# Patient Record
Sex: Female | Born: 1980 | Race: White | Hispanic: No | Marital: Single | Smoking: Former smoker
Health system: Southern US, Community
[De-identification: ages and names within clinical notes are randomized; demographics above are authoritative.]

## PROBLEM LIST (undated history)

## (undated) HISTORY — PX: TONSILLECTOMY: SUR1361

## (undated) HISTORY — PX: WISDOM TOOTH EXTRACTION: SHX21

---

## 2005-09-15 ENCOUNTER — Emergency Department (HOSPITAL_COMMUNITY): Admission: EM | Admit: 2005-09-15 | Discharge: 2005-09-15 | Payer: Self-pay | Admitting: Emergency Medicine

## 2007-03-31 ENCOUNTER — Ambulatory Visit: Payer: Self-pay | Admitting: Family Medicine

## 2007-03-31 DIAGNOSIS — S8410XA Injury of peroneal nerve at lower leg level, unspecified leg, initial encounter: Secondary | ICD-10-CM | POA: Insufficient documentation

## 2007-03-31 DIAGNOSIS — F17200 Nicotine dependence, unspecified, uncomplicated: Secondary | ICD-10-CM

## 2007-11-16 ENCOUNTER — Encounter: Payer: Self-pay | Admitting: Family Medicine

## 2007-11-16 ENCOUNTER — Ambulatory Visit: Payer: Self-pay | Admitting: Family Medicine

## 2007-11-17 ENCOUNTER — Encounter: Payer: Self-pay | Admitting: Family Medicine

## 2007-11-18 ENCOUNTER — Encounter: Payer: Self-pay | Admitting: Family Medicine

## 2009-09-05 ENCOUNTER — Ambulatory Visit: Payer: Self-pay | Admitting: Family Medicine

## 2009-09-05 DIAGNOSIS — R002 Palpitations: Secondary | ICD-10-CM | POA: Insufficient documentation

## 2009-09-05 LAB — CONVERTED CEMR LAB
Alkaline Phosphatase: 45 units/L (ref 39–117)
BUN: 9 mg/dL (ref 6–23)
CO2: 23 meq/L (ref 19–32)
Cholesterol: 211 mg/dL — ABNORMAL HIGH (ref 0–200)
Creatinine, Ser: 0.87 mg/dL (ref 0.40–1.20)
Glucose, Bld: 92 mg/dL (ref 70–99)
HCT: 40.3 % (ref 36.0–46.0)
HDL: 53 mg/dL (ref >39)
MCHC: 33 g/dL (ref 30.0–36.0)
MCV: 96 fL (ref 78.0–100.0)
RBC: 4.2 M/uL (ref 3.87–5.11)
Total Bilirubin: 0.5 mg/dL (ref 0.3–1.2)
Total CHOL/HDL Ratio: 4
Triglycerides: 91 mg/dL (ref <150)
VLDL: 18 mg/dL (ref 0–40)
WBC: 8.7 10*3/uL (ref 4.0–10.5)

## 2009-09-07 ENCOUNTER — Encounter: Payer: Self-pay | Admitting: Family Medicine

## 2010-01-16 ENCOUNTER — Ambulatory Visit: Payer: Self-pay | Admitting: Family Medicine

## 2010-03-05 NOTE — Assessment & Plan Note (Signed)
Summary: cpe/pap,tcb  [facility] [address]  Vital Signs:  Patient profile:   30 year old female Height:      65.5 inches Weight:      175.4 pounds BMI:     28.85 Temp:     98.6 degrees F Pulse rate:   76 / minute BP sitting:   148 / 84  Vitals Entered By: Golden Circle RN (September 05, 2009 8:26 AM)  History of Present Illness: here for check up had pap 2 y ago--prefers to do q 3 y no problems--using niva ring. Menses regular. Unhappy witgh her weight--says she is working out 3x per week but is not as strict about eating as she once was wants to stop smoking--has questions about chantix etc  Habits & Providers  Alcohol-Tobacco-Diet     Tobacco Status: current     Tobacco Counseling: to quit use of tobacco products     Cigarette Packs/Day: 0.5  Exercise-Depression-Behavior     Does Patient Exercise: yes     Exercise (avg: min/session): 30-60     Times/week: 3     Seat Belt Use: always     Sun Exposure: infrequent     Sun Exposure Counseling: wears sunscreen  Current Medications (verified): 1)  Nuvaring 0.12-0.015 Mg/24hr Ring (Etonogestrel-Ethinyl Estradiol) .... Per Vagina As Directed Disp 3 Months 2)  Wellbutrin Sr 150 Mg Xr12h-Tab (Bupropion Hcl) .... Generic Ok Sig 1 By Mouth Once Daily As Directed  Allergies (verified): No Known Drug Allergies  Past History:  Past Medical History: Last updated: 03/31/2007 left peroneal nerve neuropathy--recurrent--EMGs by neurology reportedly normal-- ([place] [facility]) smoker  Past Surgical History: Last updated: 03/31/2007 none  Family History: Last updated: 03/31/2007 MGF ASCVD early  Social History: Last updated: 03/31/2007 works as Engineer, civil (consulting) L and D smokes 1/2 ppd--quit successully once for  months using patch--restarted for stress reduction  Risk Factors: Exercise: yes (09/05/2009)  Risk Factors: Smoking Status: current (09/05/2009) Packs/Day: 0.5 (09/05/2009)  Social History: Packs/Day:  0.5 Seat  Belt Use:  always Does Patient Exercise:  yes Sun Exposure-Excessive:  infrequent  Review of Systems       Complete 14 point review of systems is otherwise normal   Physical Exam  General:  alert, well-developed, well-nourished, and well-hydrated.   Eyes:  pupils equal, pupils round, and pupils reactive to light.   Ears:  R ear normal and L ear normal.   Neck:  supple, full ROM, no masses, no thyromegaly, and no carotid bruits.   Breasts:  skin/areolae normal, no masses, no abnormal thickening, no nipple discharge, no tenderness, and no adenopathy.   Lungs:  normal breath sounds and no wheezes.   Heart:  normal rate, regular rhythm, and no murmur.   Abdomen:  soft, non-tender, and normal bowel sounds.   Genitalia:  normal introitus, no external lesions, no vaginal discharge, normal uterus size and position, and no adnexal masses or tenderness.   Msk:  normal ROM, no joint tenderness, no joint swelling, and no joint warmth.   Pulses:  2+ B =DP Extremities:  No clubbing, cyanosis, edema, or deformity noted with normal full range of motion of all joints.   Neurologic:  alert & oriented X3, strength normal in all extremities, gait normal, and DTRs symmetrical and normal.   Skin:  turgor normal, color normal, no rashes, and no suspicious lesions.   Psych:  Oriented X3, memory intact for recent and remote, normally interactive, good eye contact, not anxious appearing, and not  depressed appearing.     Impression & Recommendations:  Problem # 1:  EXAMINATION, ROUTINE MEDICAL (ICD-V70.0)  Orders: CBC-[facility] ([address]) Comp Met-[facility] [phone]) Lipid-[facility] ([id number]) TSH-[facility] [phone]) [facility] - Est  18-39 yrs ([address])  Problem # 2:  PALPITATIONS (ICD-785.1)  Orders: TSH-[facility] (28413-24401)  Complete Medication List: 1)  Nuvaring 0.12-0.015 Mg/24hr Ring (Etonogestrel-ethinyl estradiol) .... Per vagina as directed disp 3 months 2)  Wellbutrin Sr 150 Mg Xr12h-tab (Bupropion hcl) ....  Generic ok sig 1 by mouth once daily as directed  Patient Instructions: 1)  Your next pap smear will be in one year.  2)  I would like to see you to follow up our discussion and the new medicine in about a month. 3)  I will send you a note about your labs. 4)  Great to see you! Prescriptions: WELLBUTRIN SR 150 MG XR12H-TAB (BUPROPION HCL) generic ok sig 1 by mouth once daily as directed  #30 x 2   Entered and Authorized by:   [name] MD   Signed by:   [name] MD on 09/05/2009   Method used:   [facility]ically [id number]       [address]       [place], [place]  [address]       Ph: [phone] or [phone]       Fax: [phone]   RxID:   [id number]   Appended Document: cpe/pap,tcb starting well butrin for smoking cessation

## 2010-03-05 NOTE — Letter (Signed)
Summary: LAB Letter  [facility]  [address]   [place], [place] [address]   Phone: [phone]  Fax: [phone]    09/07/2009  Michaela Lee [address] [place], [place]  [address]  Dear Ms. Oshiro,  Total cholesterol is 211 and that is OK. Your LDL (bad cholesterol is 140 and your goal is less than 160 so you are Ok there too. Your HDL (good cholesterol) is great at 53. All in all your lipid panel is great! All of the other labs were normal as well.         Triglyceride              91 mg/dL                    <324   HDL Cholesterol           53 mg/dL                    >40   Total Chol/HDL Ratio      4.0 Ratio    LDL Cholesterol (Calc)                             140 mg/dL                          Sincerely,   [name] MD   Appended Document: LAB Letter mailed

## 2010-03-07 NOTE — Assessment & Plan Note (Signed)
Summary: discuss meds/tlb   Vital Signs:  Patient profile:   30 year old female Weight:      180 pounds Temp:     98.1 degrees F oral Pulse rate:   70 / minute Pulse rhythm:   regular BP sitting:   126 / 84  (left arm) Cuff size:   regular  Vitals Entered By: Loralee Pacas CMA (January 16, 2010 10:00 AM) CC: meds   CC:  meds.  History of Present Illness: f/u starting welbutrin for smoking cessation--first two weeks she took it she felt a little "weepy", but then in thrird week she lost all desire for a cigarette. Did well until her Mom had a stroke-2 weeks ago.  She had no more refills andshe is back to smoking. Wants to restart welbutrin. Otherwise is feeling well. Did not have any problems with eth welbutrin.  Allergies: No Known Drug Allergies  Review of Systems       The patient complains of weight gain.  The patient denies anorexia, fever, weight loss, and dyspnea on exertion.    Physical Exam  General:  alert, well-developed, well-nourished, and well-hydrated.   Lungs:  normal breath sounds.     Impression & Recommendations:  Problem # 1:  SMOKER (ICD-305.1)  will restar welbutrin--we spent > 50% 40 minute ov discussing tobacco addiction and treatment options. rtc 2-3 months.I think at this time we willplan to do a full 4-6 m of welbutrin and she is in agreement.  Orders: [facility]- Est  Level 4 ([address])  Complete Medication List: 1)  Nuvaring 0.12-0.015 Mg/24hr Ring (Etonogestrel-ethinyl estradiol) .... Per vagina as directed disp 3 months 2)  Wellbutrin Sr 150 Mg Xr12h-tab (Bupropion hcl) .... Generic ok sig 1 by mouth once daily as directed Prescriptions: WELLBUTRIN SR 150 MG XR12H-TAB (BUPROPION HCL) generic ok sig 1 by mouth once daily as directed  #30 x 5   Entered and Authorized by:   [name] MD   Signed by:   [name] MD on 01/16/2010   Method used:   [facility] to        [facility] [address] [id number]       [address]       [place], [place]   [address]       Ph: [phone] or [phone]       Fax: [phone]   RxID:   [id number]    Orders Added: 1)  [facility]- Est  Level 4 [[address]]

## 2010-03-14 ENCOUNTER — Encounter: Payer: Self-pay | Admitting: *Deleted

## 2010-12-12 ENCOUNTER — Other Ambulatory Visit: Payer: Self-pay | Admitting: Family Medicine

## 2011-07-14 ENCOUNTER — Emergency Department (HOSPITAL_BASED_OUTPATIENT_CLINIC_OR_DEPARTMENT_OTHER): Payer: Managed Care, Other (non HMO)

## 2011-07-14 ENCOUNTER — Encounter (HOSPITAL_BASED_OUTPATIENT_CLINIC_OR_DEPARTMENT_OTHER): Payer: Self-pay | Admitting: *Deleted

## 2011-07-14 ENCOUNTER — Emergency Department (HOSPITAL_BASED_OUTPATIENT_CLINIC_OR_DEPARTMENT_OTHER)
Admission: EM | Admit: 2011-07-14 | Discharge: 2011-07-14 | Disposition: A | Discharge status: Home / Self Care | Payer: Managed Care, Other (non HMO) | Attending: Emergency Medicine | Admitting: Emergency Medicine

## 2011-07-14 DIAGNOSIS — F17200 Nicotine dependence, unspecified, uncomplicated: Secondary | ICD-10-CM | POA: Insufficient documentation

## 2011-07-14 DIAGNOSIS — X58XXXA Exposure to other specified factors, initial encounter: Secondary | ICD-10-CM | POA: Insufficient documentation

## 2011-07-14 DIAGNOSIS — S93602A Unspecified sprain of left foot, initial encounter: Secondary | ICD-10-CM

## 2011-07-14 DIAGNOSIS — S8990XA Unspecified injury of unspecified lower leg, initial encounter: Secondary | ICD-10-CM | POA: Insufficient documentation

## 2011-07-14 DIAGNOSIS — Y998 Other external cause status: Secondary | ICD-10-CM | POA: Insufficient documentation

## 2011-07-14 DIAGNOSIS — Y9389 Activity, other specified: Secondary | ICD-10-CM | POA: Insufficient documentation

## 2011-07-14 NOTE — ED Provider Notes (Signed)
History     CSN: [id number]  Arrival date & time 07/14/11  5621   First MD Initiated Contact with Patient 07/14/11 0357      Chief Complaint  Patient presents with  . Foot Injury    (Consider location/radiation/quality/duration/timing/severity/associated sxs/prior treatment) HPI This-year-old white female who was at work yesterday evening. 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 She developed a sudden onset of a sharp pain on the medial aspect of her left foot. This pain radiated up her left lower leg. She states the pain is like that of shin splints although it is not originating in her shin. She's had some improvement in the pain after elevating it, taking ibuprofen and applying ice. The pain is not exacerbated by palpation that is present when she ambulates. The pain is moderate to severe at its worst, minimal at rest. Ambulatory pain is relieved by walking on the lateral aspect of her foot.  History reviewed. No pertinent past medical history.  Past Surgical History  Procedure Date  . Tonsillectomy     History reviewed. No pertinent family history.  History  Substance Use Topics  . Smoking status: Current Everyday Smoker  . Smokeless tobacco: Not on file  . Alcohol Use: No    OB History    Grav Para Term Preterm Abortions TAB SAB Ect Mult Living                  Review of Systems  All other systems reviewed and are negative.    Allergies  Review of patient's allergies indicates no known allergies.  Home Medications   Current Outpatient Rx  Name Route Sig Dispense Refill  . BUPROPION HCL ER (SR) 150 MG PO TB12  Generic ok. SIG 1 by mouth once daily as directed     . ETONOGESTREL-ETHINYL ESTRADIOL 0.12-0.015 MG/24HR VA RING  Per vagina as directed. Disp 3 months       BP 128/92  Pulse 77  Temp(Src) 98.2 F (36.8 C) (Oral)  Resp 16  SpO2 100%  LMP 07/09/2011  Physical Exam General: Well-developed, well-nourished  female in no acute distress; appearance consistent with age of record HENT: normocephalic, atraumatic Eyes: Normal appearance Neck: supple Heart: regular rate and rhythm Lungs: Normal respiratory effort and excursion Abdomen: soft; nondistended Extremities: No deformity; full range of motion; pulses normal; no swelling, ecchymosis, tenderness or instability of left foot but pain elicited on weight-bearing Neurologic: Awake, alert and oriented; motor function intact in all extremities and symmetric; no facial droop Skin: Warm and dry Psychiatric: Normal mood and affect    ED Course  Procedures (including critical care time)     MDM   Nursing notes and vitals signs, including pulse oximetry, reviewed.  Summary of this visit's results, reviewed by myself:   Imaging Studies: Dg Foot Complete Left  07/14/2011  *RADIOLOGY REPORT*  Clinical Data: Medial foot pain  LEFT FOOT - COMPLETE 3+ VIEW  Comparison: None.  Findings: No acute fracture or dislocation.  No aggressive osseous lesions.  Intact Lisfranc joint.  Posterior calcaneal enthesopathic change.  IMPRESSION: No acute osseous abnormality identified. If clinical concern for a fracture persists, recommend a repeat radiograph in 5-10 days to evaluate for interval change or callus formation.  Original Report Authenticated By: Waneta Martins, M.D.            Hanley Seamen, MD 07/14/11 [phone]

## 2011-07-14 NOTE — ED Notes (Signed)
Pt states that around 2300 she was playing with a child and jumping pt jumped onto the floor and began having severe sudden onset of sharp shooting pain on top of left foot.

## 2011-07-14 NOTE — Discharge Instructions (Signed)
Foot Sprain  The muscles and cord like structures which attach muscle to bone (tendons) that surround the feet are made up of units. A foot sprain can occur at the weakest spot in any of these units. This condition is most often caused by injury to or overuse of the foot, as from playing contact sports, or aggravating a previous injury, or from poor conditioning, or obesity.  SYMPTOMS  · Pain with movement of the foot.  · Tenderness and swelling at the injury site.  · Loss of strength is present in moderate or severe sprains.  THE THREE GRADES OR SEVERITY OF FOOT SPRAIN ARE:  · Mild (Grade I): Slightly pulled muscle without tearing of muscle or tendon fibers or loss of strength.  · Moderate (Grade II): Tearing of fibers in a muscle, tendon, or at the attachment to bone, with small decrease in strength.  · Severe (Grade III): Rupture of the muscle-tendon-bone attachment, with separation of fibers. Severe sprain requires surgical repair. Often repeating (chronic) sprains are caused by overuse. Sudden (acute) sprains are caused by direct injury or over-use.  DIAGNOSIS   Diagnosis of this condition is usually by your own observation. If problems continue, a caregiver may be required for further evaluation and treatment. X-rays may be required to make sure there are not breaks in the bones (fractures) present. Continued problems may require physical therapy for treatment.  PREVENTION  · Use strength and conditioning exercises appropriate for your sport.  · Warm up properly prior to working out.  · Use athletic shoes that are made for the sport you are participating in.  · Allow adequate time for healing. Early return to activities makes repeat injury more likely, and can lead to an unstable arthritic foot that can result in prolonged disability. Mild sprains generally heal in 3 to 10 days, with moderate and severe sprains taking 2 to 10 weeks. Your caregiver can help you determine the proper time required for  healing.  HOME CARE INSTRUCTIONS   · Apply ice to the injury for 15 to 20 minutes, 3 to 4 times per day. Put the ice in a plastic bag and place a towel between the bag of ice and your skin.  · An elastic wrap (like an Ace bandage) may be used to keep swelling down.  · Keep foot above the level of the heart, or at least raised on a footstool, when swelling and pain are present.  · Try to avoid use other than gentle range of motion while the foot is painful. Do not resume use until instructed by your caregiver. Then begin use gradually, not increasing use to the point of pain. If pain does develop, decrease use and continue the above measures, gradually increasing activities that do not cause discomfort, until you gradually achieve normal use.  · Use crutches if and as instructed, and for the length of time instructed.  · Keep injured foot and ankle wrapped between treatments.  · Massage foot and ankle for comfort and to keep swelling down. Massage from the toes up towards the knee.  · Only take over-the-counter or prescription medicines for pain, discomfort, or fever as directed by your caregiver.  SEEK IMMEDIATE MEDICAL CARE IF:   · Your pain and swelling increase, or pain is not controlled with medications.  · You have loss of feeling in your foot or your foot turns cold or blue.  · You develop new, unexplained symptoms, or an increase of the symptoms that brought you   to your caregiver.  MAKE SURE YOU:   · Understand these instructions.  · Will watch your condition.  · Will get help right away if you are not doing well or get worse.  Document Released: 07/12/2001 Document Revised: 01/09/2011 Document Reviewed: 09/09/2007  ExitCare® Patient Information ©2012 [employer].

## 2011-12-24 ENCOUNTER — Encounter: Payer: Self-pay | Admitting: Family Medicine

## 2011-12-24 ENCOUNTER — Other Ambulatory Visit (HOSPITAL_COMMUNITY)
Admission: RE | Admit: 2011-12-24 | Discharge: 2011-12-24 | Disposition: A | Discharge status: Home / Self Care | Payer: Managed Care, Other (non HMO) | Source: Ambulatory Visit | Attending: Family Medicine | Admitting: Family Medicine

## 2011-12-24 ENCOUNTER — Ambulatory Visit (INDEPENDENT_AMBULATORY_CARE_PROVIDER_SITE_OTHER): Payer: Managed Care, Other (non HMO) | Admitting: Family Medicine

## 2011-12-24 VITALS — BP 125/81 | HR 57 | Temp 98.0°F | Ht 65.0 in | Wt 178.0 lb

## 2011-12-24 DIAGNOSIS — Z01419 Encounter for gynecological examination (general) (routine) without abnormal findings: Secondary | ICD-10-CM | POA: Insufficient documentation

## 2011-12-24 DIAGNOSIS — M719 Bursopathy, unspecified: Secondary | ICD-10-CM

## 2011-12-24 DIAGNOSIS — M75100 Unspecified rotator cuff tear or rupture of unspecified shoulder, not specified as traumatic: Secondary | ICD-10-CM

## 2011-12-24 DIAGNOSIS — Z124 Encounter for screening for malignant neoplasm of cervix: Secondary | ICD-10-CM

## 2011-12-24 DIAGNOSIS — Z0000 Encounter for general adult medical examination without abnormal findings: Secondary | ICD-10-CM

## 2011-12-24 MED ORDER — BUPROPION HCL ER (SR) 150 MG PO TB12: ORAL_TABLET | ORAL | Status: DC

## 2011-12-25 ENCOUNTER — Encounter: Payer: Self-pay | Admitting: Family Medicine

## 2011-12-29 NOTE — Progress Notes (Signed)
  Subjective:    Patient ID: Michaela Lee, female    DOB: 1980-12-24, 31 y.o.   MRN: [id number]  HPI  welll check up 3 additiopnal issues 1) left shoulder pain--started with some new work out at gym. Pain with overhead lifting and forward reaching. No numbness. 2) stressors. Mood lability. Feels stressed all of the time. No SI/HI.   Friends tell her she needs to take a "chill pill". 3) wants to do zyban again as she has restarted smoking. It worked well last time.  Review of Systems  Constitutional: Negative for fever, activity change, appetite change, fatigue and unexpected weight change.  HENT: Negative for neck pain.   Eyes: Negative for pain and visual disturbance.  Respiratory: Negative for cough and shortness of breath.   Cardiovascular: Negative for chest pain.  Gastrointestinal: Negative for abdominal pain.  Genitourinary: Negative for dysuria, menstrual problem and pelvic pain.  Musculoskeletal: Negative for joint swelling.  Neurological: Negative for headaches.  Psychiatric/Behavioral: Negative for suicidal ideas, hallucinations, behavioral problems, sleep disturbance, self-injury and dysphoric mood. The patient is nervous/anxious.        Objective:   Physical Exam  Constitutional: She is oriented to person, place, and time. She appears well-developed and well-nourished.  HENT:  Head: Normocephalic and atraumatic.  Right Ear: External ear normal.  Left Ear: External ear normal.  Nose: Nose normal.  Mouth/Throat: Oropharynx is clear and moist.  Eyes: Conjunctivae normal and EOM are normal. Pupils are equal, round, and reactive to light.  Neck: Normal range of motion. Neck supple.  Cardiovascular: Normal rate, regular rhythm and normal heart sounds.   Pulmonary/Chest: Effort normal and breath sounds normal.  Abdominal: Soft. Bowel sounds are normal.  Genitourinary: Vagina normal and uterus normal.  Musculoskeletal:       All joints FROm except left shoulder which has  FROm but pain with supraspinatus testing.  Neurological: She is alert and oriented to person, place, and time. She has normal reflexes.  Skin: No rash noted.  Psychiatric: She has a normal mood and affect. Her behavior is normal. Judgment and thought content normal.     INJECTION: Patient was given informed consent, signed copy in the chart. Appropriate time out was taken. Area prepped and draped in usual sterile fashion. 1 cc of methylprednisolone 40 mg/ml plus  4 cc of 1% lidocaine without epinephrine was injected into the left subacromial bursa using a(n) posterior approach. The patient tolerated the procedure well. There were no complications. Post procedure instructions were given.      Assessment & Plan:  1. Well woman--pap. Discussed exercise 2. Smoking 3. Anxiety and stress--will restart wellbutrin for both #2 and 33. RTC 1 m. 3. Subacromial bursitis--CSI today.

## 2012-01-06 ENCOUNTER — Telehealth: Payer: Self-pay | Admitting: Family Medicine

## 2012-01-06 NOTE — Telephone Encounter (Signed)
Patient is still feelling the shoulder pain.  She was told when last here to let Dr. Jennette Kettle know if it didn't get any better after a week, but it has now been 2 weeks and it still isn't better.  She would like to speak to Dr. Jennette Kettle about what to do next.

## 2012-01-07 NOTE — Telephone Encounter (Signed)
Dear Michaela Lee Team Please tell her I am out sick and will be back tomorrow and I will call her then--then route msg back to me so I actually WILl call her THANKS! [name]

## 2012-01-07 NOTE — Telephone Encounter (Signed)
Spoke with patient and informed her of below, she says that 5:30pm will be the best time to call her due to her having to work

## 2012-01-09 NOTE — Telephone Encounter (Signed)
Left mssg ion VM. I would rec either [place] at [facility] or MRI.I will try to call her back Monday [name]

## 2012-01-13 NOTE — Telephone Encounter (Signed)
Left another voice mail asking her which option she wanted and asked her to call me with her decision ([facility] appt vs MRI) [name]

## 2012-01-25 ENCOUNTER — Other Ambulatory Visit: Payer: Self-pay | Admitting: Family Medicine

## 2012-02-02 ENCOUNTER — Other Ambulatory Visit: Payer: Self-pay | Admitting: Family Medicine

## 2012-02-02 MED ORDER — ETONOGESTREL-ETHINYL ESTRADIOL 0.12-0.015 MG/24HR VA RING: VAGINAL_RING | VAGINAL | Status: AC

## 2012-02-02 NOTE — Telephone Encounter (Signed)
Patient is calling because her pharmacy has not heard back on the refill on Nuvaring.

## 2012-02-16 ENCOUNTER — Telehealth: Payer: Self-pay | Admitting: Family Medicine

## 2012-02-16 DIAGNOSIS — M25512 Pain in left shoulder: Secondary | ICD-10-CM

## 2012-02-16 NOTE — Telephone Encounter (Signed)
Pt called her insurance and she can have an MRI if she goes thru [facility] imaging Would like to talk to Dr Jennette Kettle also

## 2012-02-18 NOTE — Telephone Encounter (Signed)
Amy See if you can set up MR ARTHROGRAm of left shoulder at [facility]. THANKS! [name]

## 2012-02-19 NOTE — Telephone Encounter (Signed)
Scheduled pt for appt MR Arthrogram Lt shoulder on 02/26/12 @ 915 am.  [facility]) [address] [place], [place] Fax [phone] Phone [phone]

## 2012-02-20 NOTE — Telephone Encounter (Signed)
Spoke with pt; gave her appt info

## 2012-02-20 NOTE — Telephone Encounter (Signed)
Left pt a VM to return my call  

## 2012-02-25 ENCOUNTER — Other Ambulatory Visit: Payer: Self-pay | Admitting: *Deleted

## 2012-02-25 ENCOUNTER — Telehealth: Payer: Self-pay | Admitting: Family Medicine

## 2012-02-25 DIAGNOSIS — M25511 Pain in right shoulder: Secondary | ICD-10-CM

## 2012-02-25 NOTE — Telephone Encounter (Signed)
Per Dr. Jennette Kettle- changed order to rt shoulder MRI- as pt states this is the side that hurts.

## 2012-02-25 NOTE — Telephone Encounter (Signed)
Spoke with pt- gave her results per Dr. Jennette Kettle.  She scheduled a f/u here at [facility] with Dr. Jennette Kettle.

## 2012-02-25 NOTE — Telephone Encounter (Signed)
Amy plz tell her the MRA of shoulder shows NO ROTATOR CUFF TEAR, Mild tendinopathy and some AC joint arthritis. If her painis still a problem, I would recommend she see me at [facility] for US guided [facility] joint injection. THANKS! .[name]

## 2012-03-01 ENCOUNTER — Ambulatory Visit (INDEPENDENT_AMBULATORY_CARE_PROVIDER_SITE_OTHER): Payer: Managed Care, Other (non HMO) | Admitting: Family Medicine

## 2012-03-01 ENCOUNTER — Encounter: Payer: Self-pay | Admitting: Family Medicine

## 2012-03-01 VITALS — BP 120/78 | HR 76 | Ht 65.0 in | Wt 178.0 lb

## 2012-03-01 DIAGNOSIS — M25511 Pain in right shoulder: Secondary | ICD-10-CM | POA: Insufficient documentation

## 2012-03-01 DIAGNOSIS — M25519 Pain in unspecified shoulder: Secondary | ICD-10-CM

## 2012-03-01 NOTE — Progress Notes (Signed)
  Subjective:    Patient ID: Michaela Lee, female    DOB: 11-16-1980, 32 y.o.   MRN: [id number]  HPI  Followup right shoulder pain. Continues to have pain when she uses her arm particularly and picking something up or if she has to raise her arm above shoulder level. No numbness in the hand. Had her MR arthrogram done. Has a lot of questions.  Review of Systems No numbness or weakness in the right upper extremity. No fever.    Objective:   Physical Exam Vital signs are reviewed GENERAL: Well-developed female no acute distress SHOULDER: Right. Full range of motion and strength in all planes the rotator cuff. She does have some pain with supraspinatus testing. Scapular motion bilaterally are symmetrical. ULTRASOUND: Some calcifications in the supraspinatus muscle insertion point. The subacromial bursa is not well seen. Off for the muscles of the rotator cuff exhibits normal anatomy with no tears. The a.c. Joint shows minimal to mild arthropathy with no effusion. There are a few osteophytes noted. IMAGING: Reviewed her MRI and MR arthrogram pictures with her.       Assessment & Plan:  #1. Supraspinatus tendinopathy. Long discussion explaining she does still have pain even though she has the rotator cuff tear. We will start her on rotator cuff program and I'll see her back in 4-6 weeks.

## 2012-03-03 ENCOUNTER — Ambulatory Visit: Payer: Managed Care, Other (non HMO) | Admitting: Family Medicine

## 2012-04-06 ENCOUNTER — Telehealth: Payer: Self-pay | Admitting: Family Medicine

## 2012-04-06 NOTE — Telephone Encounter (Signed)
Dr Jennette Kettle pt already  has stop taking med for 4 days now,she has an upcoming appt on the 14th of March and will like to discuss alternative medication or treatment. Proposito, [name]

## 2012-04-06 NOTE — Telephone Encounter (Signed)
Dear Cliffton Asters Team If she wants to stop it---yes she should taper it---but REALLY she should probably come see me so we can discuss. THANKS! [name]

## 2012-04-06 NOTE — Telephone Encounter (Signed)
Message copied by Nestor Ramp on Tue Apr 06, 2012  3:14 PM ------      Message from: Lizbeth Bark      Created: Tue Apr 06, 2012 11:46 AM      Regarding: med question      Contact: [phone]       Pt called states that her medication Wellbutrin is causing anxiety,depression. She wants to quit taking it, but wants to know if she needs to wean off of it. This is a pt of yours from [facility] but since they were closed she called here.  ------

## 2012-04-07 NOTE — Telephone Encounter (Signed)
THANKS! [name]  

## 2012-04-16 ENCOUNTER — Ambulatory Visit (INDEPENDENT_AMBULATORY_CARE_PROVIDER_SITE_OTHER): Payer: Managed Care, Other (non HMO) | Admitting: Family Medicine

## 2012-04-16 ENCOUNTER — Encounter: Payer: Self-pay | Admitting: Family Medicine

## 2012-04-16 VITALS — BP 124/82 | Ht 65.0 in | Wt 170.0 lb

## 2012-04-16 DIAGNOSIS — M7661 Achilles tendinitis, right leg: Secondary | ICD-10-CM

## 2012-04-16 DIAGNOSIS — M7660 Achilles tendinitis, unspecified leg: Secondary | ICD-10-CM

## 2012-04-16 DIAGNOSIS — M25519 Pain in unspecified shoulder: Secondary | ICD-10-CM

## 2012-04-16 DIAGNOSIS — M25511 Pain in right shoulder: Secondary | ICD-10-CM

## 2012-04-16 MED ORDER — NITROGLYCERIN 0.2 MG/HR TD PT24: MEDICATED_PATCH | TRANSDERMAL | Status: DC

## 2012-04-16 NOTE — Patient Instructions (Addendum)
Cut patch into one - fourth pieces. Place a one fourth piece of patch on  skin over affected area, changing to a new piece every 24 hours.  OK to do almost anything except run. See me back in 2-4 weeks  CALL [facility] with any problems. [id number]

## 2012-04-16 NOTE — Progress Notes (Signed)
  Subjective:    Patient ID: Michaela Lee, female    DOB: [birth year], 32 y.o.   MRN: [id number]  HPI  F/u right shoulder pain. 80% better. Still not able to do all activities such as lifting heavy things. No new symptoms of hand numbness. Has been doing her exercises regularly 2. Right achilles pain. Has been seen at podiatry and they did [place] as well asan injection and gace her a brace. This was 2 weeks ago. None of that seemed to help. Startd as insiduous pain---worse with use oof elliptical machine. Walking made worse. No swelling. No redness,no numbness.   Review of Systems See hpi     Objective:   Physical Exam  Vital signs reviewed. GENERAL: Well developed, well nourished, no acute distress RIGHT shoulder FROm in all planes of rortaor cuff with normal strenth. Still some pain wioth impingement testing. Achilles: no defect. Full strength dorsi andplantar flexion. Normal Thompsons. Mildly TTP at 1 inch above insertional area ACHILLES [place]: right: no defect. There is some surrounding fluid andincreased doppler activity      Assessment & Plan:  1. Shoulder pain---better. Neg MRI/arthrogram 2. 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

## 2012-05-03 ENCOUNTER — Ambulatory Visit (INDEPENDENT_AMBULATORY_CARE_PROVIDER_SITE_OTHER): Payer: Managed Care, Other (non HMO) | Admitting: Family Medicine

## 2012-05-03 ENCOUNTER — Encounter: Payer: Self-pay | Admitting: Family Medicine

## 2012-05-03 VITALS — BP 128/74 | HR 69 | Ht 65.0 in | Wt 170.0 lb

## 2012-05-03 DIAGNOSIS — M7660 Achilles tendinitis, unspecified leg: Secondary | ICD-10-CM | POA: Insufficient documentation

## 2012-05-03 DIAGNOSIS — M7661 Achilles tendinitis, right leg: Secondary | ICD-10-CM

## 2012-05-03 NOTE — Assessment & Plan Note (Signed)
Improved. She still quite concerned that she's not having any real significant pain and no weakness. I will start her on partial E. centric exercises, deep pressing only about half of the way down and with a slow plantarflexion phase. I wonder do all of this very controlled. Am also going to put three-quarter inch heel wedges that are 3/4 long in her workout shoes. She's got a lot of things going on in her life right now including her grandmothers in the hospital I think she's very worried about her Achilles tendon, very worried that she's not been able to exercise and lose the weight she's gained. See her back in 3 weeks

## 2012-05-03 NOTE — Progress Notes (Signed)
  Subjective:    Patient ID: Michaela Lee, female    DOB: Sep 28, 1980, 32 y.o.   MRN: [id number]  HPI #1. Followup right Achilles tendinitis. She has been using the nitroglycerin patch. She increased the size of the patch from 1/4-1/2 in hopes that she would have quicker improvement. She's had no side effects. She still has some twinges of pain when she does a lot of fast walking. Was on the treadmill and had the speed said at 3.2, which she says is slower than her usual speed, and did not have any sharp pain but felt some twinges. She got frustrated and stopped.   Review of Systems Denies any numbness in the foot. No redness of the Achilles tendon. No fever.    Objective:   Physical Exam Vital signs are reviewed GENERAL: Well-developed female no acute distress ACHILLES TENDON: Right. No defect. Normal plantar and dorsiflexion it is painless. No tenderness to palpation around the heel.       Assessment & Plan:

## 2012-06-14 ENCOUNTER — Ambulatory Visit (INDEPENDENT_AMBULATORY_CARE_PROVIDER_SITE_OTHER): Payer: Managed Care, Other (non HMO) | Admitting: Family Medicine

## 2012-06-14 ENCOUNTER — Encounter: Payer: Self-pay | Admitting: Family Medicine

## 2012-06-14 VITALS — BP 115/78 | HR 86 | Ht 65.0 in | Wt 170.0 lb

## 2012-06-14 DIAGNOSIS — M7661 Achilles tendinitis, right leg: Secondary | ICD-10-CM

## 2012-06-14 DIAGNOSIS — M7660 Achilles tendinitis, unspecified leg: Secondary | ICD-10-CM

## 2012-06-14 DIAGNOSIS — M25571 Pain in right ankle and joints of right foot: Secondary | ICD-10-CM

## 2012-06-14 DIAGNOSIS — M25579 Pain in unspecified ankle and joints of unspecified foot: Secondary | ICD-10-CM

## 2012-06-14 NOTE — Patient Instructions (Addendum)
[facility] Dr. Luiz Blare Monday May 19th at 930a [address] [place] [phone]

## 2012-06-14 NOTE — Progress Notes (Signed)
  Subjective:    Patient ID: Michaela Lee, female    DOB: 09-20-1980, 32 y.o.   MRN: [id number]  HPI Right ankle pain. We have been treating her for Achilles tendinitis with the nitroglycerin patch. That's a little bit better. She still wakes up at night with some occasional random tingly pains in the Achilles but overall it is better  Twice in the last few weeks she's had to miss work because of a new kind of ankle pain. Both times it occurred when she was walking very fast down the hall at work. She felt a sharp anterior ankle pain that lasts for several hours. During that time she was unable to bear weight. She's had no new ankle injury that she is aware of. She did have ankle twisting episode a few months ago. She's very frustrated that this is continuing to cause her problems.   Review of Systems No ankle redness, no ankle warmth. No fever.    Objective:   Physical Exam Vital signs are reviewed GENERAL: Well-developed female no acute distress ANKLE: Right. Anterior drawer is normal. Inversion and eversion is normal she can do several heel raises on the single stance on that foot without pain. The ankle joint itself reveals no effusion, nontender to palpation.       Assessment & Plan:  Ankle pain that does not seem to related to Achilles tendinitis. I am unclear what this could be when she's having some type of beard think she ever joint capsule. This point I think we need a second opinion about options for further workup and I'll set her up to see orthopedics

## 2013-03-09 ENCOUNTER — Telehealth: Payer: Self-pay | Admitting: Family Medicine

## 2013-03-09 NOTE — Telephone Encounter (Signed)
I already take care of three generations so I would be happy to add her in as a patient THANKS! Michaela LevySara Haywood Meinders

## 2013-03-09 NOTE — Telephone Encounter (Signed)
Pt would like to ask for special permission for Dr Jennette KettleNeal to take her mother as a new patient. Her mother is Michaela Lee  [birth year]55 Mother went to urgent care today because she was light headed. Her BP was 201/107. She has had a stroke in the past Please advise

## 2013-03-09 NOTE — Telephone Encounter (Signed)
Dr. Jennette KettleNeal not currently taking new patients.  Will route request to Dr. Jennette KettleNeal.  [name], [name], RN

## 2013-03-09 NOTE — Telephone Encounter (Signed)
Will route to Michaela ShayJackie Lee (new Pt coordinator) to set up appt with Dr. Jennette KettleNeal.  [name], [name], RN

## 2013-03-23 ENCOUNTER — Encounter (INDEPENDENT_AMBULATORY_CARE_PROVIDER_SITE_OTHER): Payer: Self-pay

## 2013-03-23 ENCOUNTER — Encounter: Payer: Self-pay | Admitting: Family Medicine

## 2013-03-23 ENCOUNTER — Ambulatory Visit (INDEPENDENT_AMBULATORY_CARE_PROVIDER_SITE_OTHER): Payer: Managed Care, Other (non HMO) | Admitting: Family Medicine

## 2013-03-23 VITALS — BP 120/80 | HR 76 | Ht 66.0 in | Wt 174.0 lb

## 2013-03-23 DIAGNOSIS — M25571 Pain in right ankle and joints of right foot: Secondary | ICD-10-CM

## 2013-03-23 DIAGNOSIS — M25579 Pain in unspecified ankle and joints of unspecified foot: Secondary | ICD-10-CM

## 2013-03-23 NOTE — Patient Instructions (Signed)
You were given a repeat cortisone injection today. Cam walker with heel lifts and crutches over next 7-10 days. Light duty as written for next 10 days. Icing 15 minutes at a time 3-4 times a day as needed. Do not do achilles exercises during this rest period. Follow up here or with Dr. Jennette KettleNeal in 7-10 days.

## 2013-03-24 ENCOUNTER — Telehealth: Payer: Self-pay | Admitting: Family Medicine

## 2013-03-25 ENCOUNTER — Encounter: Payer: Self-pay | Admitting: Family Medicine

## 2013-03-25 DIAGNOSIS — M25571 Pain in right ankle and joints of right foot: Secondary | ICD-10-CM | POA: Insufficient documentation

## 2013-03-25 NOTE — Telephone Encounter (Signed)
She will have to be out of work then until we see her back in about 10 days.  Thanks!

## 2013-03-25 NOTE — Progress Notes (Signed)
Patient ID: Michaela Lee, female   DOB: [birth year], 33 y.o.   MRN: [id number]  PCP: Denny LevySara Neal, MD  Subjective:   HPI: Patient is a 33 y.o. female here for right ankle pain.  Patient reports she has had issues with right ankle dating back to July 2013 - when dancing inverted ankle. She had been treated mainly for achilles tendinitis including exercise program, icing, OTCs as needed, nitro patches. Didn't improve and started to get pain radiating anteriorly - referred to ortho. Had an MRI that showed some low signal within tibiotalar joint possibly due to loose bodies. Was given an intraarticular injection which helped until past 2 days. Went back to running admittedly more quickly than she should have. Pain developed similarly posterior ankle radiating to front. Is 7/10 level of pain. Difficulty bearing weight. Tried doing home exercises which seemed to worsen her pain. Tried heel cups, orthotics past couple days also.  History reviewed. No pertinent past medical history.  Current Outpatient Prescriptions on File Prior to Visit  Medication Sig Dispense Refill  . etonogestrel-ethinyl estradiol (NUVARING) 0.12-0.015 MG/24HR vaginal ring Insert vaginally and leave in place for 3 consecutive weeks, then remove for 1 week.  3 each  11  . nitroGLYCERIN (NITRODUR - DOSED IN MG/24 HR) 0.2 mg/hr Cut patch into one - fourth pieces. Place a one fourth piece of patch on  skin over affected area, changing to a new piece every 24 hours.  30 patch  1   No current facility-administered medications on file prior to visit.    Past Surgical History  Procedure Laterality Date  . Tonsillectomy    . Wisdom tooth extraction      No Known Allergies  History   Social History  . Marital Status: Single    Spouse Name: N/A    Number of Children: N/A  . Years of Education: N/A   Occupational History  . Not on file.   Social History Main Topics  . Smoking status: Former Smoker    Quit date:  01/08/2012  . Smokeless tobacco: Not on file  . Alcohol Use: No  . Drug Use: No  . Sexual Activity: Not on file   Other Topics Concern  . Not on file   Social History Narrative  . No narrative on file    Family History  Problem Relation Age of Onset  . Hypertension Mother     BP 120/80  Pulse 76  Ht 5\' 6"  (1.676 m)  Wt 174 lb (78.926 kg)  BMI 28.10 kg/m2  Review of Systems: See HPI above.    Objective:  Physical Exam:  Gen: NAD  Right ankle/foot: No gross deformity, swelling, ecchymoses FROM ankle with 5/5 strength all directions. TTP within achilles and anterior ankle joint but very mild.  Pain level greater than that of exam. Negative ant drawer and talar tilt.   Negative syndesmotic compression. Thompsons test negative. NV intact distally.  MSK u/s:  No evidence of tear, neovascularity of achilles.    Assessment & Plan:  1. Right foot/ankle pain - likely related to combination of achilles tendinopathy and probable loose bodies within ankle joint.  Repeated her intraarticular cortisone injection today.  Cam walker with heel lifts, crutches.  Placed on light duty.  Icing, nsaids as needed.  F/u in 7-10 days to reassess.  After informed written consent patient was seated on exam table.  Ultrasound used to identify lateral aspect of right ankle joint, alcohol swab used for prep then right ankle  injected intraarticularly with 2:1 marcaine: depomedrol.  Patient tolerated procedure well without immediate complications.

## 2013-03-25 NOTE — Assessment & Plan Note (Signed)
likely related to combination of achilles tendinopathy and probable loose bodies within ankle joint.  Repeated her intraarticular cortisone injection today.  Cam walker with heel lifts, crutches.  Placed on light duty.  Icing, nsaids as needed.  F/u in 7-10 days to reassess.  After informed written consent patient was seated on exam table.  Ultrasound used to identify lateral aspect of right ankle joint, alcohol swab used for prep then right ankle injected intraarticularly with 2:1 marcaine: depomedrol.  Patient tolerated procedure well without immediate complications.

## 2013-03-30 ENCOUNTER — Encounter: Payer: Self-pay | Admitting: Family Medicine

## 2013-03-30 ENCOUNTER — Ambulatory Visit (INDEPENDENT_AMBULATORY_CARE_PROVIDER_SITE_OTHER): Payer: Managed Care, Other (non HMO) | Admitting: Family Medicine

## 2013-03-30 VITALS — BP 126/85 | HR 70 | Ht 66.0 in | Wt 171.0 lb

## 2013-03-30 DIAGNOSIS — M25579 Pain in unspecified ankle and joints of unspecified foot: Secondary | ICD-10-CM

## 2013-03-30 DIAGNOSIS — M25571 Pain in right ankle and joints of right foot: Secondary | ICD-10-CM

## 2013-03-30 NOTE — Patient Instructions (Signed)
Ok to return to work Monday with restrictions. Must wear boot at all times still for next 2 weeks (except if on couch, lying down, icing, bathing). In 2 weeks start walking without boot just around the house. Follow up with me in 4 weeks - hopeful we will start therapy or home exercises at this time.

## 2013-04-01 ENCOUNTER — Encounter: Payer: Self-pay | Admitting: Family Medicine

## 2013-04-01 ENCOUNTER — Ambulatory Visit: Payer: Managed Care, Other (non HMO) | Admitting: Family Medicine

## 2013-04-01 NOTE — Progress Notes (Signed)
Patient ID: Michaela Lee, female   DOB: [birth year], 33 y.o.   MRN: [id number]  PCP: Michaela LevySara Neal, MD  Subjective:   HPI: Patient is a 33 y.o. female here for right ankle pain.  2/18: Patient reports she has had issues with right ankle dating back to July 2013 - when dancing inverted ankle. She had been treated mainly for achilles tendinitis including exercise program, icing, OTCs as needed, nitro patches. Didn't improve and started to get pain radiating anteriorly - referred to ortho. Had an MRI that showed some low signal within tibiotalar joint possibly due to loose bodies. Was given an intraarticular injection which helped until past 2 days. Went back to running admittedly more quickly than she should have. Pain developed similarly posterior ankle radiating to front. Is 7/10 level of pain. Difficulty bearing weight. Tried doing home exercises which seemed to worsen her pain. Tried heel cups, orthotics past couple days also.  2/25: Patient reports pain is now a 0/10 following injection and immobilization in cam walker Does get twinges once in a while. On light duty because nothing available with current restrictions. They will allow her to wear boot at work though. Not taking any medicines for pain now.  History reviewed. No pertinent past medical history.  Current Outpatient Prescriptions on File Prior to Visit  Medication Sig Dispense Refill  . cholecalciferol (VITAMIN D) 1000 UNITS tablet Take 1,000 Units by mouth daily.      Marland Kitchen. etonogestrel-ethinyl estradiol (NUVARING) 0.12-0.015 MG/24HR vaginal ring Insert vaginally and leave in place for 3 consecutive weeks, then remove for 1 week.  3 each  11  . Multiple Vitamins-Minerals (MULTIVITAMIN WITH MINERALS) tablet Take 1 tablet by mouth daily.      . nitroGLYCERIN (NITRODUR - DOSED IN MG/24 HR) 0.2 mg/hr Cut patch into one - fourth pieces. Place a one fourth piece of patch on  skin over affected area, changing to a new piece every 24  hours.  30 patch  1  . Omega-3 Fatty Acids (FISH OIL) 1000 MG CAPS Take by mouth.      . phentermine 37.5 MG capsule Take 37.5 mg by mouth every morning.       No current facility-administered medications on file prior to visit.    Past Surgical History  Procedure Laterality Date  . Tonsillectomy    . Wisdom tooth extraction      No Known Allergies  History   Social History  . Marital Status: Single    Spouse Name: N/A    Number of Children: N/A  . Years of Education: N/A   Occupational History  . Not on file.   Social History Main Topics  . Smoking status: Former Smoker    Quit date: 01/08/2012  . Smokeless tobacco: Not on file  . Alcohol Use: No  . Drug Use: No  . Sexual Activity: Not on file   Other Topics Concern  . Not on file   Social History Narrative  . No narrative on file    Family History  Problem Relation Age of Onset  . Hypertension Mother     BP 126/85  Pulse 70  Ht 5\' 6"  (1.676 m)  Wt 171 lb (77.565 kg)  BMI 27.61 kg/m2  Review of Systems: See HPI above.    Objective:  Physical Exam:  Gen: NAD  Right ankle/foot: No gross deformity, swelling, ecchymoses FROM ankle with 5/5 strength all directions. No tenderness within achilles or anterior ankle joint Negative ant drawer and talar  tilt.   Negative syndesmotic compression. Thompsons test negative. NV intact distally.    Assessment & Plan:  1. Right foot/ankle pain - 2/2 combination of achilles tendinopathy and probable loose bodies within ankle joint.  Much better following ankle injection and cam walker with heel lifts.  Icing, nsaids as needed.  Written to return to work Monday with frequent breaks (see note) and wearing cam walker.  In 2 weeks start walking around house out of boot.  F/u in 4 weeks - hopefully can start ankle and achilles rehab at that time.

## 2013-04-01 NOTE — Assessment & Plan Note (Signed)
2/2 combination of achilles tendinopathy and probable loose bodies within ankle joint.  Much better following ankle injection and cam walker with heel lifts.  Icing, nsaids as needed.  Written to return to work Monday with frequent breaks (see note) and wearing cam walker.  In 2 weeks start walking around house out of boot.  F/u in 4 weeks - hopefully can start ankle and achilles rehab at that time.

## 2013-04-18 ENCOUNTER — Telehealth: Payer: Self-pay | Admitting: Family Medicine

## 2013-04-18 NOTE — Telephone Encounter (Signed)
Ask her to use boot only half the day for a few days first - if no pain she can do completely without it by Thursday or Friday.  Thanks!

## 2013-04-26 ENCOUNTER — Ambulatory Visit (INDEPENDENT_AMBULATORY_CARE_PROVIDER_SITE_OTHER): Payer: Managed Care, Other (non HMO) | Admitting: Family Medicine

## 2013-04-26 ENCOUNTER — Encounter (INDEPENDENT_AMBULATORY_CARE_PROVIDER_SITE_OTHER): Payer: Self-pay

## 2013-04-26 ENCOUNTER — Encounter: Payer: Self-pay | Admitting: Family Medicine

## 2013-04-26 VITALS — BP 136/82 | HR 56 | Ht 66.0 in | Wt 166.0 lb

## 2013-04-26 DIAGNOSIS — M25579 Pain in unspecified ankle and joints of unspecified foot: Secondary | ICD-10-CM

## 2013-04-26 DIAGNOSIS — M25571 Pain in right ankle and joints of right foot: Secondary | ICD-10-CM

## 2013-04-27 ENCOUNTER — Encounter: Payer: Self-pay | Admitting: Family Medicine

## 2013-04-27 NOTE — Assessment & Plan Note (Signed)
2/2 combination of achilles tendinopathy and probable loose bodies within ankle joint.  At this point not having any achilles symptoms however.  Afraid she may have caused one of these loose bodies to get to a point where it wedges in ankle joint for brief periods causing her pain.  Would not repeat injection at this time.  Continue with current treatment - boot half day at work, icing, nsaids as needed.  If this starts to worsen instead of improve from this point advised her she should return to orthopedist to consider arthroscopy.  F/u prn.

## 2013-04-27 NOTE — Progress Notes (Signed)
Patient ID: Fernande Brasoni Helf, female   DOB: Sep 19, 1980, 33 y.o.   MRN: [id number]  PCP: Denny LevySara Neal, MD  Subjective:   HPI: Patient is a 33 y.o. female here for right ankle pain.  2/18: Patient reports she has had issues with right ankle dating back to July 2013 - when dancing inverted ankle. She had been treated mainly for achilles tendinitis including exercise program, icing, OTCs as needed, nitro patches. Didn't improve and started to get pain radiating anteriorly - referred to ortho. Had an MRI that showed some low signal within tibiotalar joint possibly due to loose bodies. Was given an intraarticular injection which helped until past 2 days. Went back to running admittedly more quickly than she should have. Pain developed similarly posterior ankle radiating to front. Is 7/10 level of pain. Difficulty bearing weight. Tried doing home exercises which seemed to worsen her pain. Tried heel cups, orthotics past couple days also.  2/25: Patient reports pain is now a 0/10 following injection and immobilization in cam walker Does get twinges once in a while. On light duty because nothing available with current restrictions. They will allow her to wear boot at work though. Not taking any medicines for pain now.  3/24: Patient reports she had been doing extremely well. Up to walking a half day without boot at work. Then past Friday she tried to do a 30 minute yoga tape and started getting brief sharp shooting pains going to back of ankle (last about 1 second). Nothing in particular seems to bring these on. No swelling, bruising with this.  History reviewed. No pertinent past medical history.  Current Outpatient Prescriptions on File Prior to Visit  Medication Sig Dispense Refill  . cholecalciferol (VITAMIN D) 1000 UNITS tablet Take 1,000 Units by mouth daily.      Marland Kitchen. etonogestrel-ethinyl estradiol (NUVARING) 0.12-0.015 MG/24HR vaginal ring Insert vaginally and leave in place for 3  consecutive weeks, then remove for 1 week.  3 each  11  . Multiple Vitamins-Minerals (MULTIVITAMIN WITH MINERALS) tablet Take 1 tablet by mouth daily.      . nitroGLYCERIN (NITRODUR - DOSED IN MG/24 HR) 0.2 mg/hr Cut patch into one - fourth pieces. Place a one fourth piece of patch on  skin over affected area, changing to a new piece every 24 hours.  30 patch  1  . Omega-3 Fatty Acids (FISH OIL) 1000 MG CAPS Take by mouth.      . phentermine 37.5 MG capsule Take 37.5 mg by mouth every morning.       No current facility-administered medications on file prior to visit.    Past Surgical History  Procedure Laterality Date  . Tonsillectomy    . Wisdom tooth extraction      No Known Allergies  History   Social History  . Marital Status: Single    Spouse Name: N/A    Number of Children: N/A  . Years of Education: N/A   Occupational History  . Not on file.   Social History Main Topics  . Smoking status: Former Smoker    Quit date: 01/08/2012  . Smokeless tobacco: Not on file  . Alcohol Use: No  . Drug Use: No  . Sexual Activity: Not on file   Other Topics Concern  . Not on file   Social History Narrative  . No narrative on file    Family History  Problem Relation Age of Onset  . Hypertension Mother     BP 136/82  Pulse 56  Ht 5\' 6"  (1.676 m)  Wt 166 lb (75.297 kg)  BMI 26.81 kg/m2  Review of Systems: See HPI above.    Objective:  Physical Exam:  Gen: NAD  Right ankle/foot: No gross deformity, swelling, ecchymoses FROM ankle with 5/5 strength all directions. No tenderness within achilles or anterior ankle joint Negative ant drawer and talar tilt.   Negative syndesmotic compression. Thompsons test negative. NV intact distally.    Assessment & Plan:  1. Right foot/ankle pain - 2/2 combination of achilles tendinopathy and probable loose bodies within ankle joint.  At this point not having any achilles symptoms however.  Afraid she may have caused one of  these loose bodies to get to a point where it wedges in ankle joint for brief periods causing her pain.  Would not repeat injection at this time.  Continue with current treatment - boot half day at work, icing, nsaids as needed.  If this starts to worsen instead of improve from this point advised her she should return to orthopedist to consider arthroscopy.  F/u prn.

## 2013-04-28 ENCOUNTER — Ambulatory Visit: Payer: Managed Care, Other (non HMO) | Admitting: Family Medicine

## 2013-04-29 ENCOUNTER — Ambulatory Visit: Payer: Managed Care, Other (non HMO) | Admitting: Family Medicine

## 2013-10-06 ENCOUNTER — Encounter: Payer: Self-pay | Admitting: Family Medicine

## 2013-10-06 NOTE — Progress Notes (Unsigned)
Patient would like you to call her concerning her anxiety and school.  She is having issues when taking tests and the school would be able to give her extended time for test taking if she had a dx from her doctor. Please call her at [phone].

## 2013-10-07 NOTE — Progress Notes (Unsigned)
Patient ID: Michaela Lee, female   DOB: Jun 16, 1980, 33 y.o.   MRN: [id number] [employer] Extra time for tests is a very complicated subject as most of the school systems require special documentation. She needs to make an appointment with me so we can discuss this THANKS! [name]

## 2013-10-07 NOTE — Progress Notes (Unsigned)
Relayed message,patient agrees and will schedule office visit at Sports Medicine since she's needing a follow up their.Michaela Lee, [name]

## 2013-10-24 ENCOUNTER — Encounter: Payer: Self-pay | Admitting: Family Medicine

## 2013-10-24 ENCOUNTER — Ambulatory Visit (INDEPENDENT_AMBULATORY_CARE_PROVIDER_SITE_OTHER): Payer: Managed Care, Other (non HMO) | Admitting: Family Medicine

## 2013-10-24 VITALS — BP 127/75 | Ht 65.5 in | Wt 165.0 lb

## 2013-10-24 DIAGNOSIS — M25512 Pain in left shoulder: Secondary | ICD-10-CM

## 2013-10-24 DIAGNOSIS — M25519 Pain in unspecified shoulder: Secondary | ICD-10-CM

## 2013-10-24 DIAGNOSIS — F411 Generalized anxiety disorder: Secondary | ICD-10-CM | POA: Insufficient documentation

## 2013-10-24 DIAGNOSIS — Z8742 Personal history of other diseases of the female genital tract: Secondary | ICD-10-CM | POA: Insufficient documentation

## 2013-10-24 MED ORDER — ESCITALOPRAM OXALATE 20 MG PO TABS: 20.0000 mg | ORAL_TABLET | Freq: Every day | ORAL | Status: AC

## 2013-10-25 ENCOUNTER — Telehealth: Payer: Self-pay | Admitting: Family Medicine

## 2013-10-25 NOTE — Telephone Encounter (Signed)
See the documentation up in my additional phone note for today.

## 2013-10-25 NOTE — Telephone Encounter (Signed)
Spoke at length with the patient. She took the Lexapro last night and had a very restless night, felt nauseated and threw up. This morning she awoke and felt like her always chattering. She's concerned that all these side effects are related to the medication. I reminded her that when I saw her yesterday she was quite anxious and having many of the same symptoms. We discussed at length. Her and decision was to skip a day taking no Lexapro today, then take half a pill a day starting on the following day.  I think she's having a lot of anxiety if we can get her on this medicine for week or so she'll have some significant him prudent. She has no suicidal or homicidal ideation. I spoke with her she is at work and doing okay.

## 2013-10-25 NOTE — Telephone Encounter (Signed)
Left message to return call. Please ask the patient what side effects she is having and from what medication.Taniah Reinecke, Rodena Medin

## 2013-10-25 NOTE — Telephone Encounter (Signed)
Pt called and has a lot of side of effect from her new medication. She was wondering if she should cut it in half? Also she is taking it at 8:30 at night. jw

## 2013-10-25 NOTE — Telephone Encounter (Signed)
After taking pill last night, has nausea, vomiting body shaking and teeth chattering even though not col Took the whole pill last night and still having these  affects this morning

## 2013-10-27 NOTE — Assessment & Plan Note (Signed)
I think her anxiety is likely cause of her shoulder issues--muscle tension . We discussed at length. Greater than 50% of our 45 minute office visit was spent in counseling and education regarding these issues. Ultimately we decided on a trial of lexapro. F/u 1 m  She will restart her HEP that she had for rihgt shoulder on her left. She still has theraband and exercise HO.

## 2013-10-27 NOTE — Progress Notes (Signed)
Patient ID: Michaela Lee, female   DOB: 22-Aug-1980, 33 y.o.   MRN: [id number]  Heiley Shaikh - 33 y.o. female MRN [id number]  Date of birth: 26-Oct-1980    SUBJECTIVE:     2 issues: 1. Left shoulder pain for last 1-2 m.No specific injury. Similar problems  In past with her right shoulder (neg MR arthrogram). Pain is so bad she is having trouble sleeping. No specific movement makes it worse. 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 2. Significant increase in her anxiety in last few weeks. Found out she has to complete additional degree or her job is likely in [place]. HAs enrolled in on line courses and feels she is just overwhelmed. Crying a lot, feeling nervous and agitated. ROS:     Pertinent review of systems: negative for fever or unusual weight change. Denies SI/HI, admits to excessive tearfulness, difficulty concentrating, agitation, anxiety and difficulty sleeping.    PERTINENT  PMH / PSH FH / / SH:  Past Medical, Surgical, Social, and Family History Reviewed & Updated in the EMR.  Pertinent findings include:  Recent dx by her OBGYN of pribable PCOS Smoker (intermittent) FH anxiety single  OBJECTIVE: BP 127/75  Ht 5' 5.5" (1.664 m)  Wt 165 lb (74.844 kg)  BMI 27.03 kg/m2  Physical Exam:  Vital signs are reviewed. Vital signs reviewed. GENERAL: Well-developed, well-nourished, no acute distress. SHOULDER: B muscle bulk and tone is symmetrical Left shoulder reveals no rotator cuff weakness, FROM, no signs of impingement. Distally NV intact in LUE. PSYCH: Tearful, occasionally angry. AxO x4. Asks and answers questions appropriately. No hallucinations. Some pressure to speech but normal speech content..Normal remote and recent memory.    ASSESSMENT & PLAN:  See problem based charting & AVS for pt instructions.

## 2014-05-19 IMAGING — CR DG FOOT COMPLETE 3+V*L*
3 series · 3 of 3 positions shown · non-contrast
Comparison: None.

CLINICAL DATA: Medial foot pain

LEFT FOOT - COMPLETE 3+ VIEW

[t foot ap left]
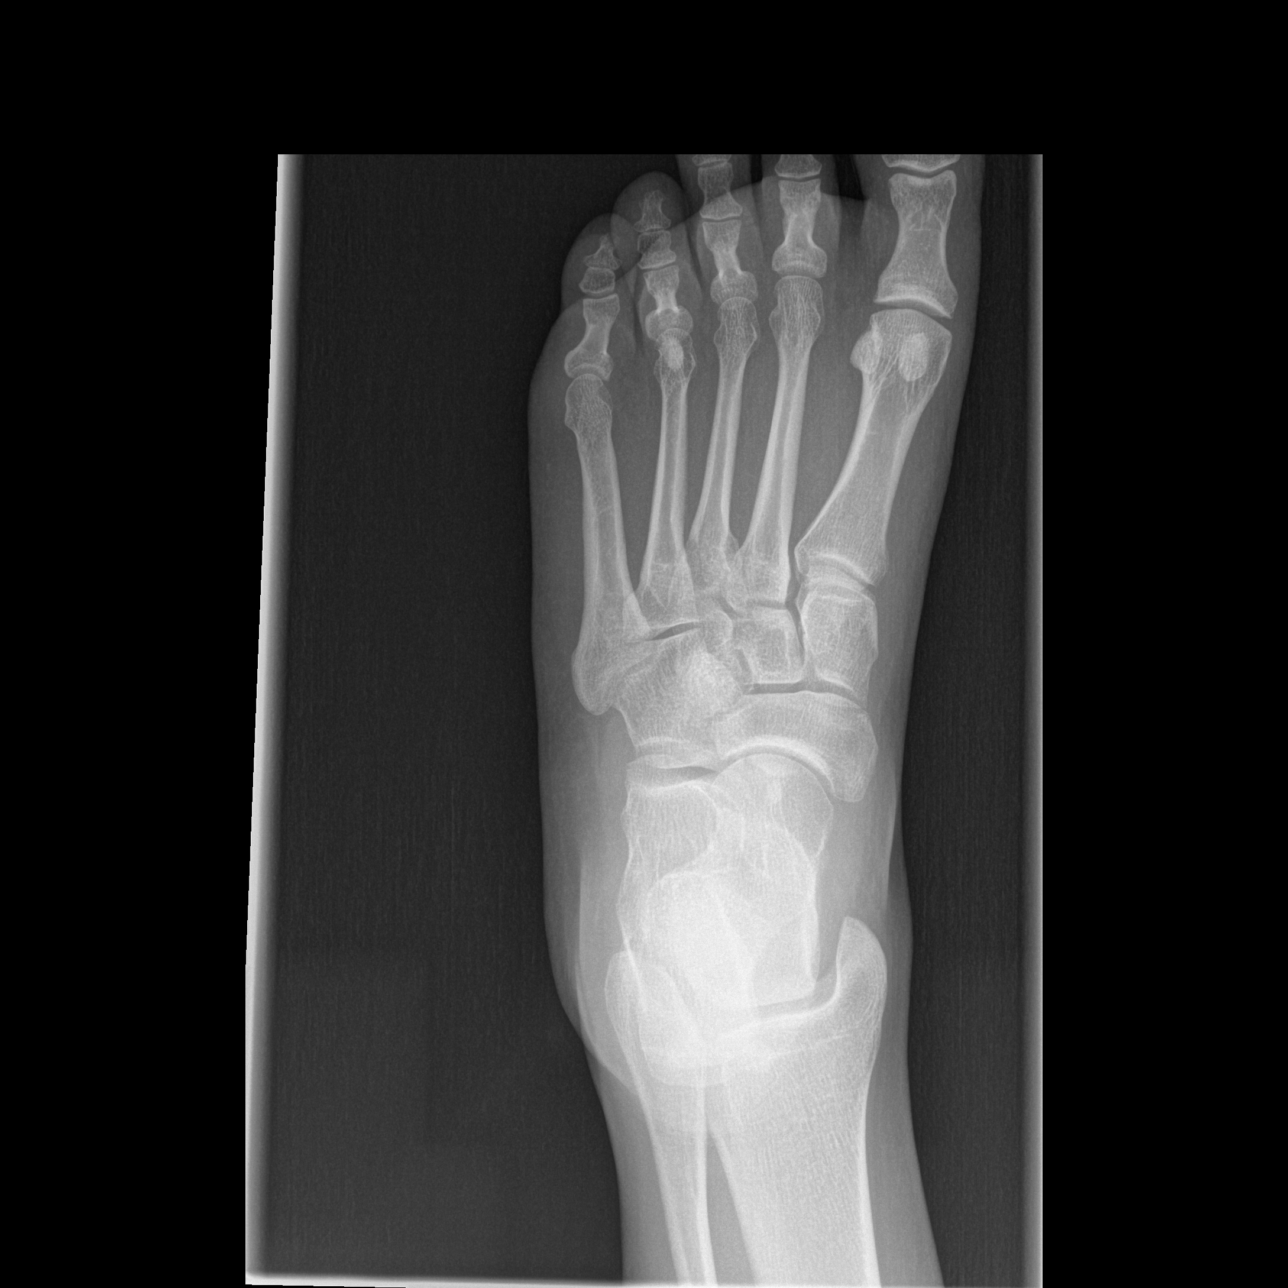

[t foot oblique left]
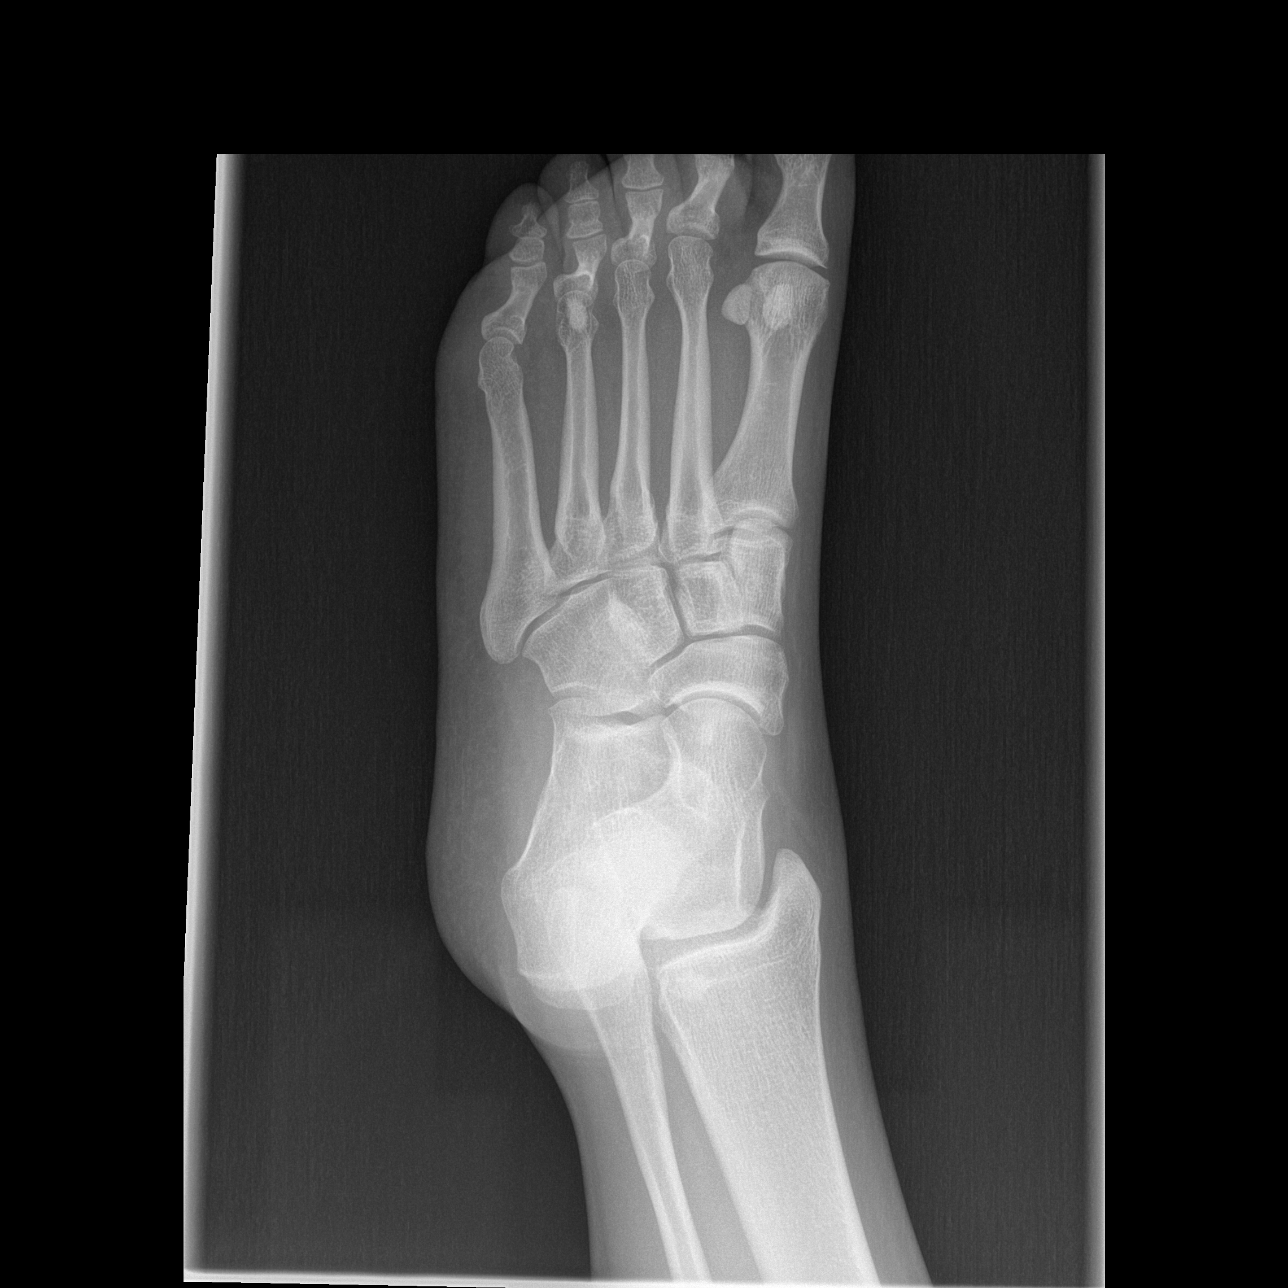

[t foot lat left *]
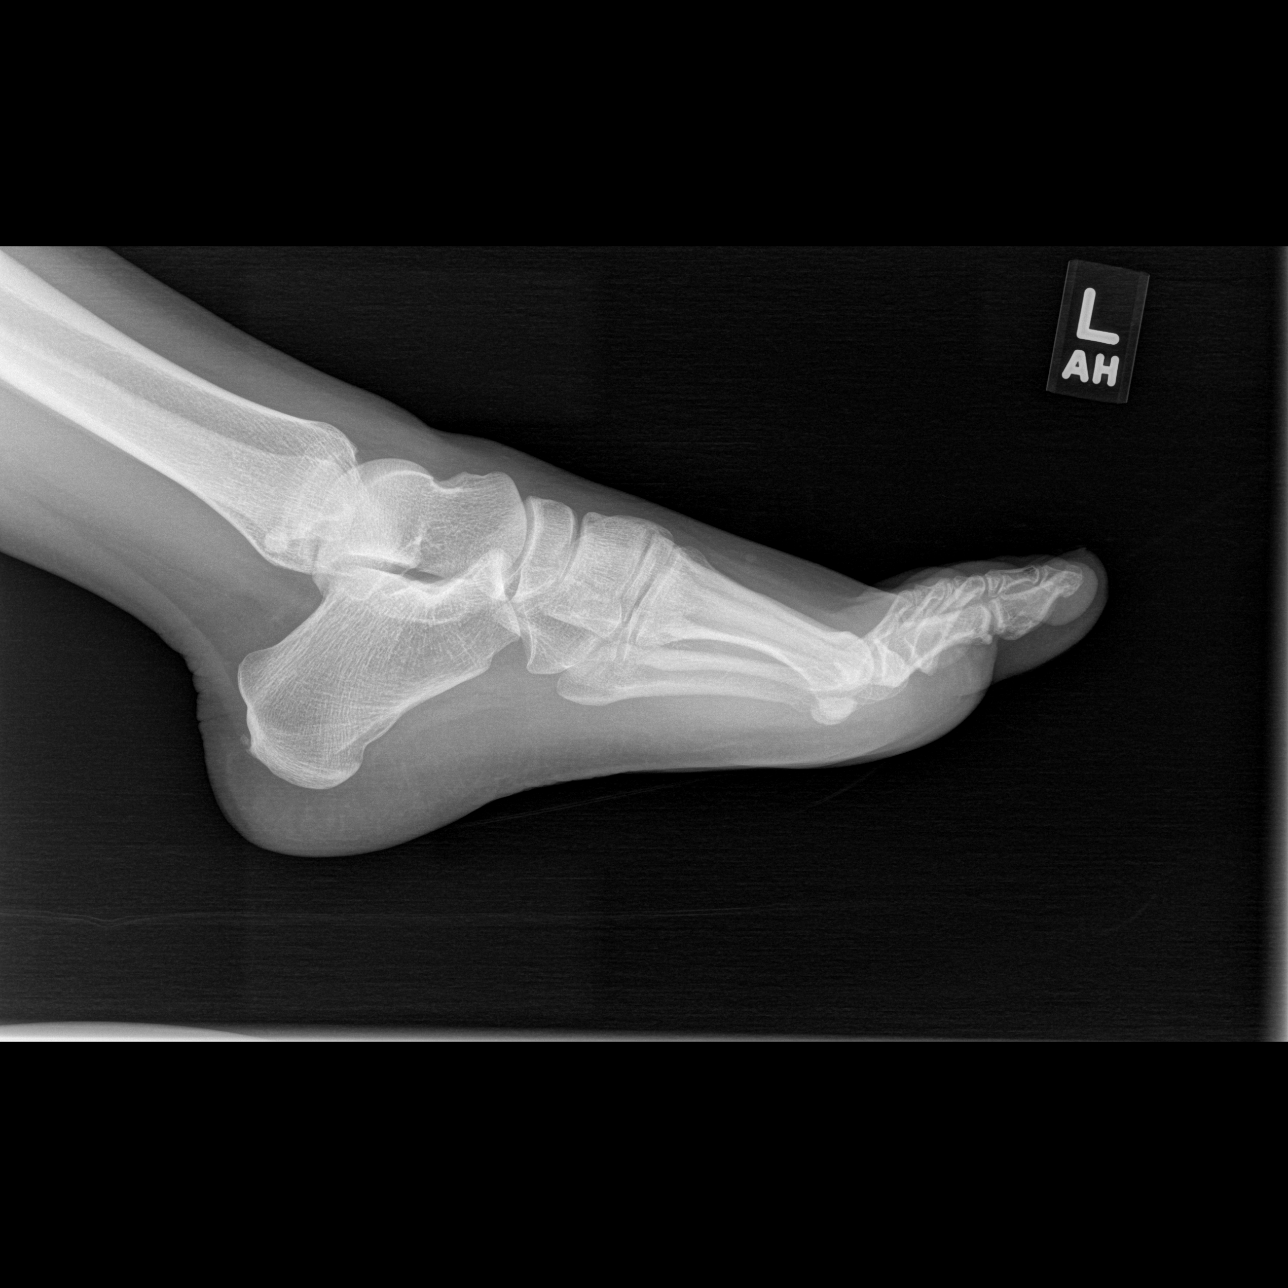

[3 of 3 positions shown; findings below may reference images not displayed]

FINDINGS: No acute fracture or dislocation.  No aggressive osseous
lesions.  Intact Lisfranc joint.  Posterior calcaneal enthesopathic
change.
IMPRESSION: No acute osseous abnormality identified. If clinical concern for a
fracture persists, recommend a repeat radiograph in 5-10 days to
evaluate for interval change or callus formation.
